# Patient Record
Sex: Female | Born: 2000 | Race: Black or African American | Hispanic: No | Marital: Single | Smoking: Never smoker
Health system: Southern US, Community
[De-identification: ages and names within clinical notes are randomized; demographics above are authoritative.]

---
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
## 2019-01-28 DIAGNOSIS — Z20828 Contact with and (suspected) exposure to other viral communicable diseases: Secondary | ICD-10-CM | POA: Diagnosis not present

## 2019-03-12 DIAGNOSIS — R519 Headache, unspecified: Secondary | ICD-10-CM | POA: Diagnosis not present

## 2019-03-12 DIAGNOSIS — Z68.41 Body mass index (BMI) pediatric, 85th percentile to less than 95th percentile for age: Secondary | ICD-10-CM | POA: Diagnosis not present

## 2019-03-17 DIAGNOSIS — R519 Headache, unspecified: Secondary | ICD-10-CM | POA: Diagnosis not present

## 2019-03-17 DIAGNOSIS — F419 Anxiety disorder, unspecified: Secondary | ICD-10-CM | POA: Diagnosis not present

## 2019-03-17 DIAGNOSIS — R5383 Other fatigue: Secondary | ICD-10-CM | POA: Diagnosis not present

## 2019-10-03 DIAGNOSIS — R519 Headache, unspecified: Secondary | ICD-10-CM | POA: Diagnosis not present

## 2019-10-03 DIAGNOSIS — H539 Unspecified visual disturbance: Secondary | ICD-10-CM | POA: Diagnosis not present

## 2019-10-03 DIAGNOSIS — H538 Other visual disturbances: Secondary | ICD-10-CM | POA: Diagnosis not present

## 2019-10-04 DIAGNOSIS — H538 Other visual disturbances: Secondary | ICD-10-CM | POA: Diagnosis not present

## 2019-10-04 DIAGNOSIS — R519 Headache, unspecified: Secondary | ICD-10-CM | POA: Diagnosis not present

## 2019-10-04 DIAGNOSIS — H539 Unspecified visual disturbance: Secondary | ICD-10-CM | POA: Diagnosis not present
